# Patient Record
Sex: Female | Born: 1950 | Race: White | Hispanic: No | Marital: Married | Smoking: Never smoker
Health system: Southern US, Community
[De-identification: ages and names within clinical notes are randomized; demographics above are authoritative.]

## PROBLEM LIST (undated history)

## (undated) HISTORY — PX: TONSILLECTOMY: SUR1361

---

## 2015-05-11 ENCOUNTER — Emergency Department (HOSPITAL_BASED_OUTPATIENT_CLINIC_OR_DEPARTMENT_OTHER): Payer: Worker's Compensation

## 2015-05-11 ENCOUNTER — Encounter (HOSPITAL_BASED_OUTPATIENT_CLINIC_OR_DEPARTMENT_OTHER): Payer: Self-pay | Admitting: *Deleted

## 2015-05-11 ENCOUNTER — Emergency Department (HOSPITAL_BASED_OUTPATIENT_CLINIC_OR_DEPARTMENT_OTHER)
Admission: EM | Admit: 2015-05-11 | Discharge: 2015-05-11 | Disposition: A | Discharge status: Home / Self Care | Payer: Worker's Compensation | Attending: Emergency Medicine | Admitting: Emergency Medicine

## 2015-05-11 DIAGNOSIS — Y9341 Activity, dancing: Secondary | ICD-10-CM | POA: Insufficient documentation

## 2015-05-11 DIAGNOSIS — Y9289 Other specified places as the place of occurrence of the external cause: Secondary | ICD-10-CM | POA: Insufficient documentation

## 2015-05-11 DIAGNOSIS — Z880 Allergy status to penicillin: Secondary | ICD-10-CM | POA: Diagnosis not present

## 2015-05-11 DIAGNOSIS — S8391XA Sprain of unspecified site of right knee, initial encounter: Secondary | ICD-10-CM | POA: Diagnosis not present

## 2015-05-11 DIAGNOSIS — Y998 Other external cause status: Secondary | ICD-10-CM | POA: Insufficient documentation

## 2015-05-11 DIAGNOSIS — X500XXA Overexertion from strenuous movement or load, initial encounter: Secondary | ICD-10-CM | POA: Diagnosis not present

## 2015-05-11 DIAGNOSIS — S8991XA Unspecified injury of right lower leg, initial encounter: Secondary | ICD-10-CM | POA: Diagnosis present

## 2015-05-11 NOTE — ED Provider Notes (Signed)
CSN: [id number]     Arrival date & time 05/11/15  1606 History   First MD Initiated Contact with Patient 05/11/15 1629     Chief Complaint  Patient presents with  . Knee Injury     (Consider location/radiation/quality/duration/timing/severity/associated sxs/prior Treatment) HPI Comments: Patient is a 65 year old female with no significant past medical history. She presents for evaluation of right knee pain. 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 She reports pain to the inside of her right knee. She is able to ambulate, however there is some discomfort involved. She denies any prior significant injury to this knee.  Patient is a 65 y.o. female presenting with knee pain. The history is provided by the patient.  Knee Pain Location:  Knee Time since incident:  1 day Injury: yes   Knee location:  R knee Pain details:    Quality:  Aching   Radiates to:  Does not radiate   Severity:  Moderate   Onset quality:  Sudden   Timing:  Constant   Progression:  Unchanged Chronicity:  New   History reviewed. No pertinent past medical history. Past Surgical History  Procedure Laterality Date  . Tonsillectomy     No family history on file. Social History  Substance Use Topics  . Smoking status: Never Smoker   . Smokeless tobacco: None  . Alcohol Use: Yes     Comment: occ   OB History    No data available     Review of Systems  All other systems reviewed and are negative.     Allergies  Penicillins  Home Medications   Prior to Admission medications   Not on File   BP 163/72 mmHg  Pulse 66  Temp(Src) 98.3 F (36.8 C) (Oral)  Resp 18  Ht  (1.626 m)  Wt 140 lb (63.504 kg)  BMI 24.02 kg/m2  SpO2 100% Physical Exam  Constitutional: She is oriented to person, place, and time. She appears well-developed and well-nourished. No distress.  HENT:  Head: Normocephalic and atraumatic.  Neck: Normal  range of motion. Neck supple.  Musculoskeletal:  The right knee appears grossly normal. There is no effusion. She has good range of motion without crepitus. Anterior and posterior drawer tests are negative and there is no laxity with varus or valgus stress. Distal PMS is intact.  Neurological: She is alert and oriented to person, place, and time.  Skin: Skin is warm and dry. She is not diaphoretic.  Nursing note and vitals reviewed.   ED Course  Procedures (including critical care time) Labs Review Labs Reviewed - No data to display  Imaging Review Dg Knee Complete 4 Views Right  05/11/2015  CLINICAL DATA:  Twisting injury while dancing, initial encounter EXAM: RIGHT KNEE - COMPLETE 4+ VIEW COMPARISON:  None. FINDINGS: Mild degenerative changes are noted medially with osteophytes. No acute fracture or dislocation is noted. No joint effusion is seen. IMPRESSION: Mild degenerative changes without acute abnormality. Electronically Signed   By: Alcide Clever M.D.   On: 05/11/2015 16:31   I have personally reviewed and evaluated these images and lab results as part of my medical decision-making.   EKG Interpretation None      MDM   Final diagnoses:  None    X-rays negative. She has tenderness over the medial aspect of the knee consistent with an MCL sprain. The knee joint is stable without evidence for internal derangement. She  will be placed in an Ace bandage and advised to rest, take NSAIDs as needed for pain, and return as needed for any problems.    Geoffery Lyons, MD 05/11/15 [phone]

## 2015-05-11 NOTE — ED Notes (Signed)
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

## 2015-05-11 NOTE — Discharge Instructions (Signed)
Wear Ace bandage as applied for the next several days.  Rest.  Ice for 20 minutes every 2 hours while awake for the next 2 days.  Follow-up with your primary Dr. if not improving in the next 1-2 weeks.   Knee Sprain A knee sprain is a tear in one of the strong, fibrous tissues that connect the bones (ligaments) in your knee. The severity of the sprain depends on how much of the ligament is torn. The tear can be either partial or complete. CAUSES  Often, sprains are a result of a fall or injury. The force of the impact causes the fibers of your ligament to stretch too much. This excess tension causes the fibers of your ligament to tear. SIGNS AND SYMPTOMS  You may have some loss of motion in your knee. Other symptoms include:  Bruising.  Pain in the knee area.  Tenderness of the knee to the touch.  Swelling. DIAGNOSIS  To diagnose a knee sprain, your health care provider will physically examine your knee. Your health care provider may also suggest an X-ray exam of your knee to make sure no bones are broken. TREATMENT  If your ligament is only partially torn, treatment usually involves keeping the knee in a fixed position (immobilization) or bracing your knee for activities that require movement for several weeks. To do this, your health care provider will apply a bandage, cast, or splint to keep your knee from moving and to support your knee during movement until it heals. For a partially torn ligament, the healing process usually takes 4-6 weeks. If your ligament is completely torn, depending on which ligament it is, you may need surgery to reconnect the ligament to the bone or reconstruct it. After surgery, a cast or splint may be applied and will need to stay on your knee for 4-6 weeks while your ligament heals. HOME CARE INSTRUCTIONS  Keep your injured knee elevated to decrease swelling.  To ease pain and swelling, apply ice to the injured area:  Put ice in a plastic  bag.  Place a towel between your skin and the bag.  Leave the ice on for 20 minutes, 2-3 times a day.  Only take medicine for pain as directed by your health care provider.  Do not leave your knee unprotected until pain and stiffness go away (usually 4-6 weeks).  If you have a cast or splint, do not allow it to get wet. If you have been instructed not to remove it, cover it with a plastic bag when you shower or bathe. Do not swim.  Your health care provider may suggest exercises for you to do during your recovery to prevent or limit permanent weakness and stiffness. SEEK IMMEDIATE MEDICAL CARE IF:  Your cast or splint becomes damaged.  Your pain becomes worse.  You have significant pain, swelling, or numbness below the cast or splint. MAKE SURE YOU:  Understand these instructions.  Will watch your condition.  Will get help right away if you are not doing well or get worse.   This information is not intended to replace advice given to you by your health care provider. Make sure you discuss any questions you have with your health care provider.   Document Released: 03/27/2005 Document Revised: 04/17/2014 Document Reviewed: 11/06/2012 [employer]vier Interactive Patient Education [employer]

## 2015-05-11 NOTE — ED Notes (Signed)
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 C/O pain to same. CNS intact.

## 2015-05-11 NOTE — ED Notes (Signed)
MD at bedside. 

## 2017-02-10 IMAGING — CR DG KNEE COMPLETE 4+V*R*
4 series · 4 of 4 positions shown · non-contrast
Comparison: None.

CLINICAL DATA: Twisting injury while dancing, initial encounter

EXAM:
RIGHT KNEE - COMPLETE 4+ VIEW

[t knee ap right]
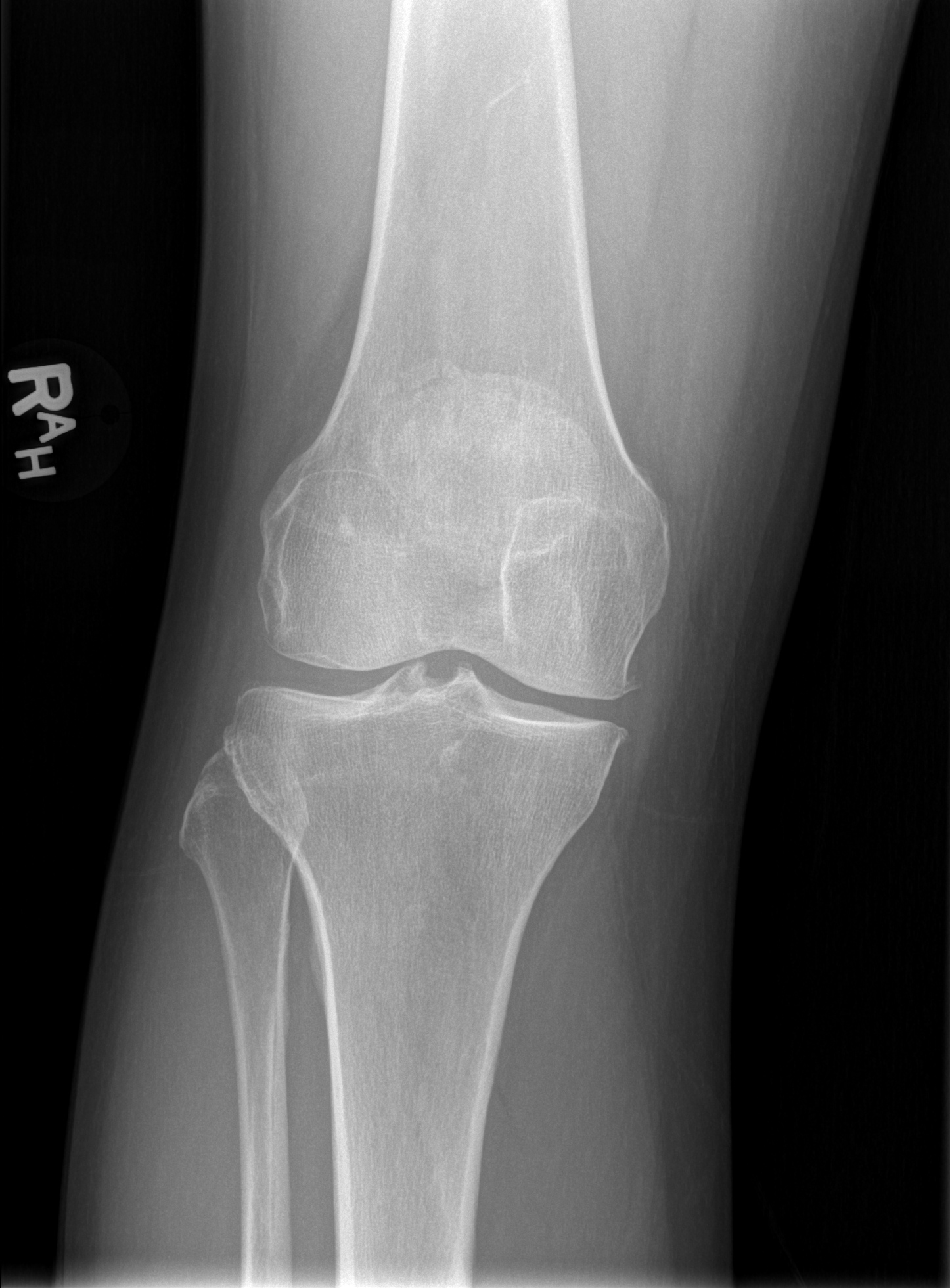

[t knee oblique right (1 of 2)]
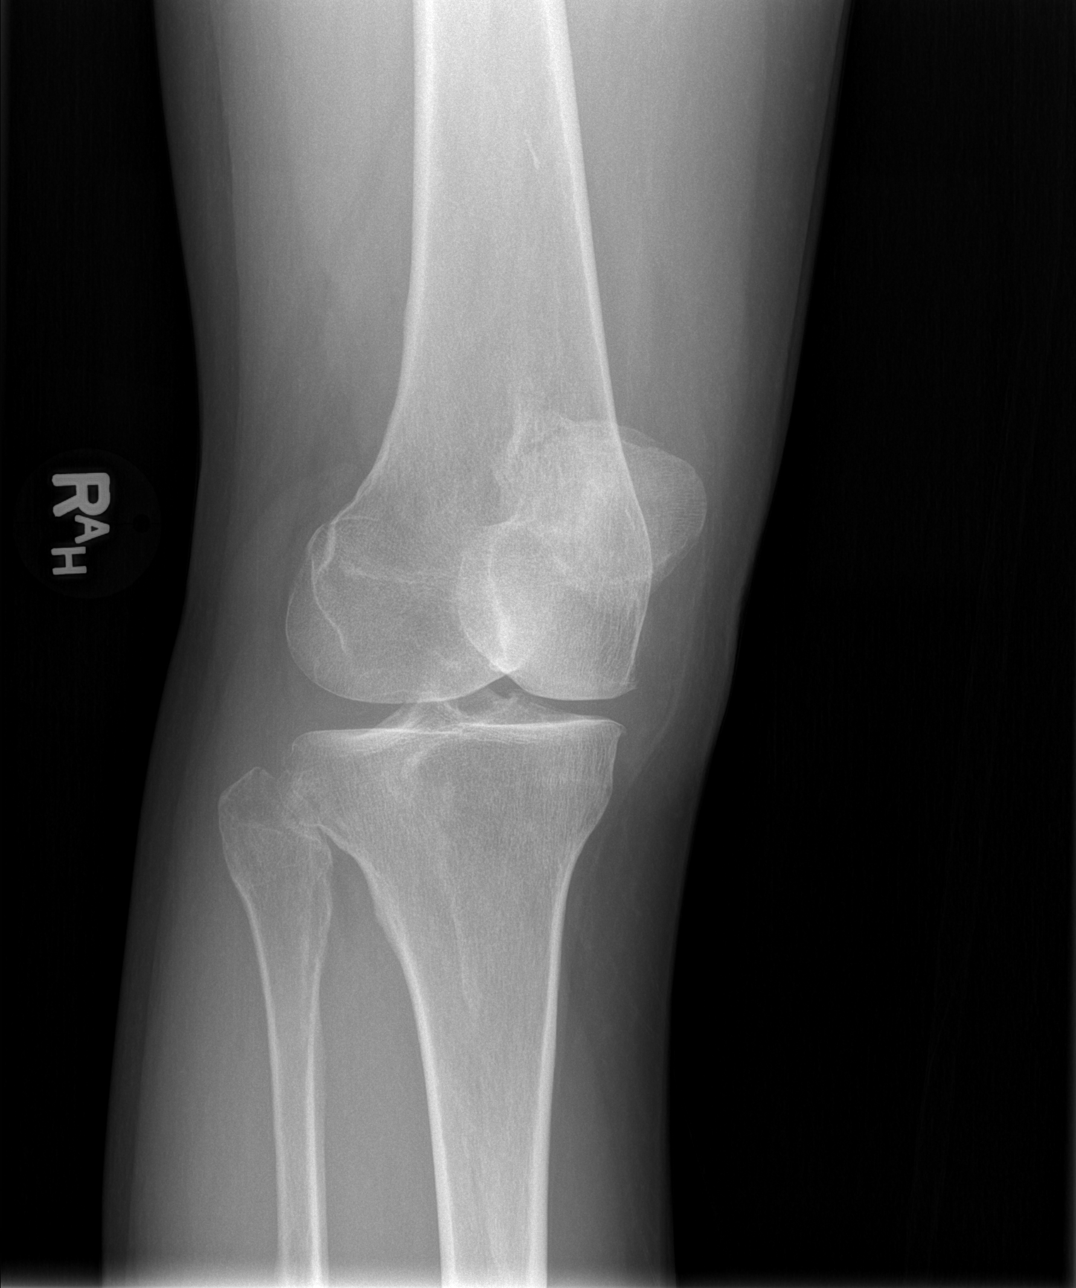

[t knee oblique right (2 of 2)]
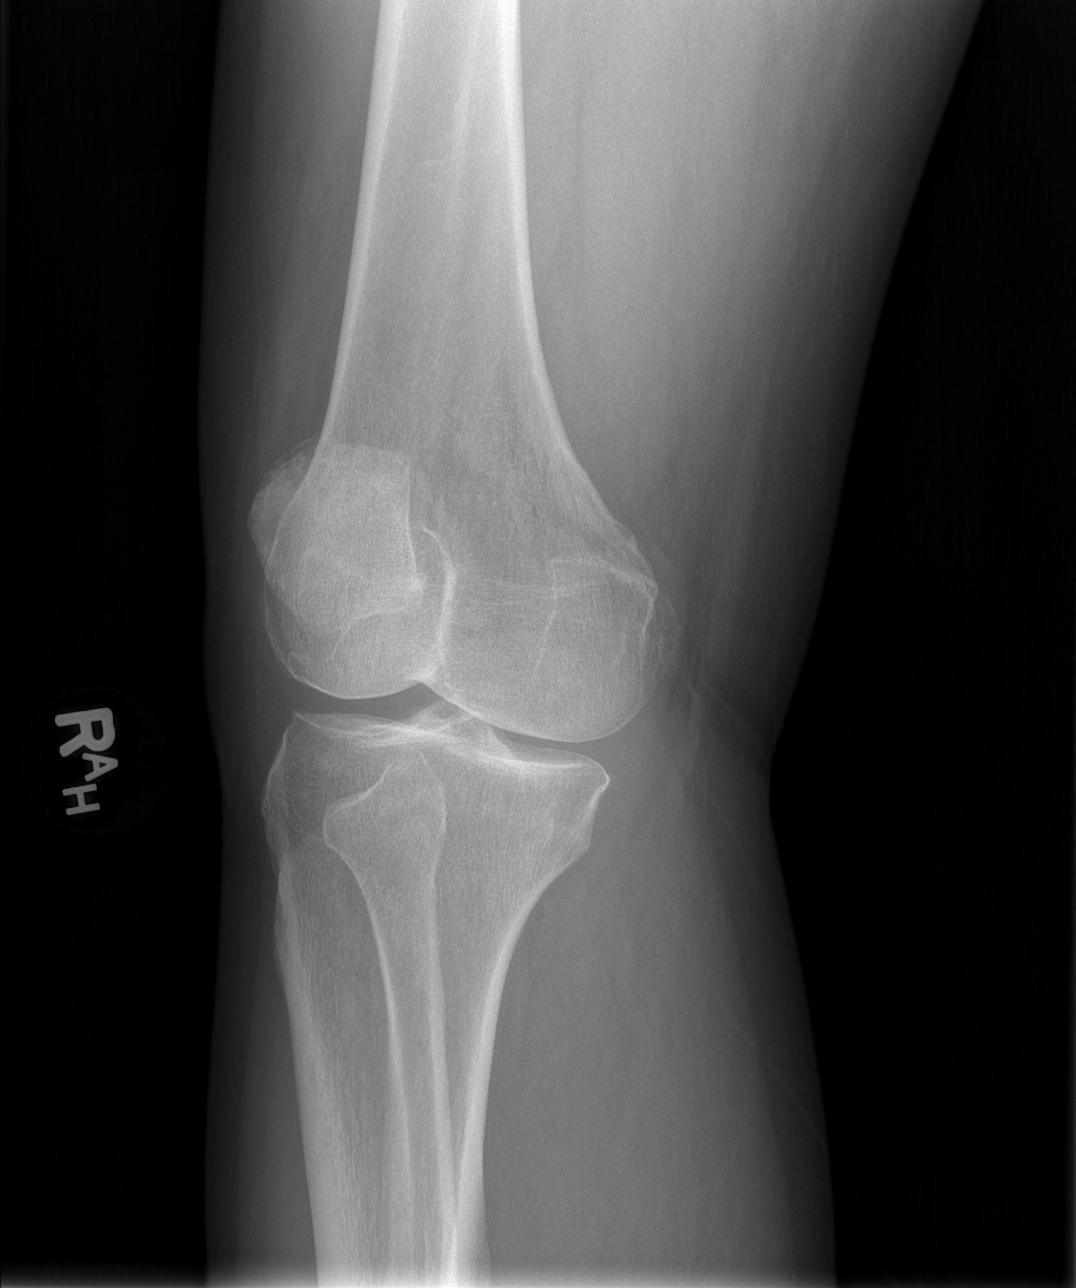

[t knee lat right]
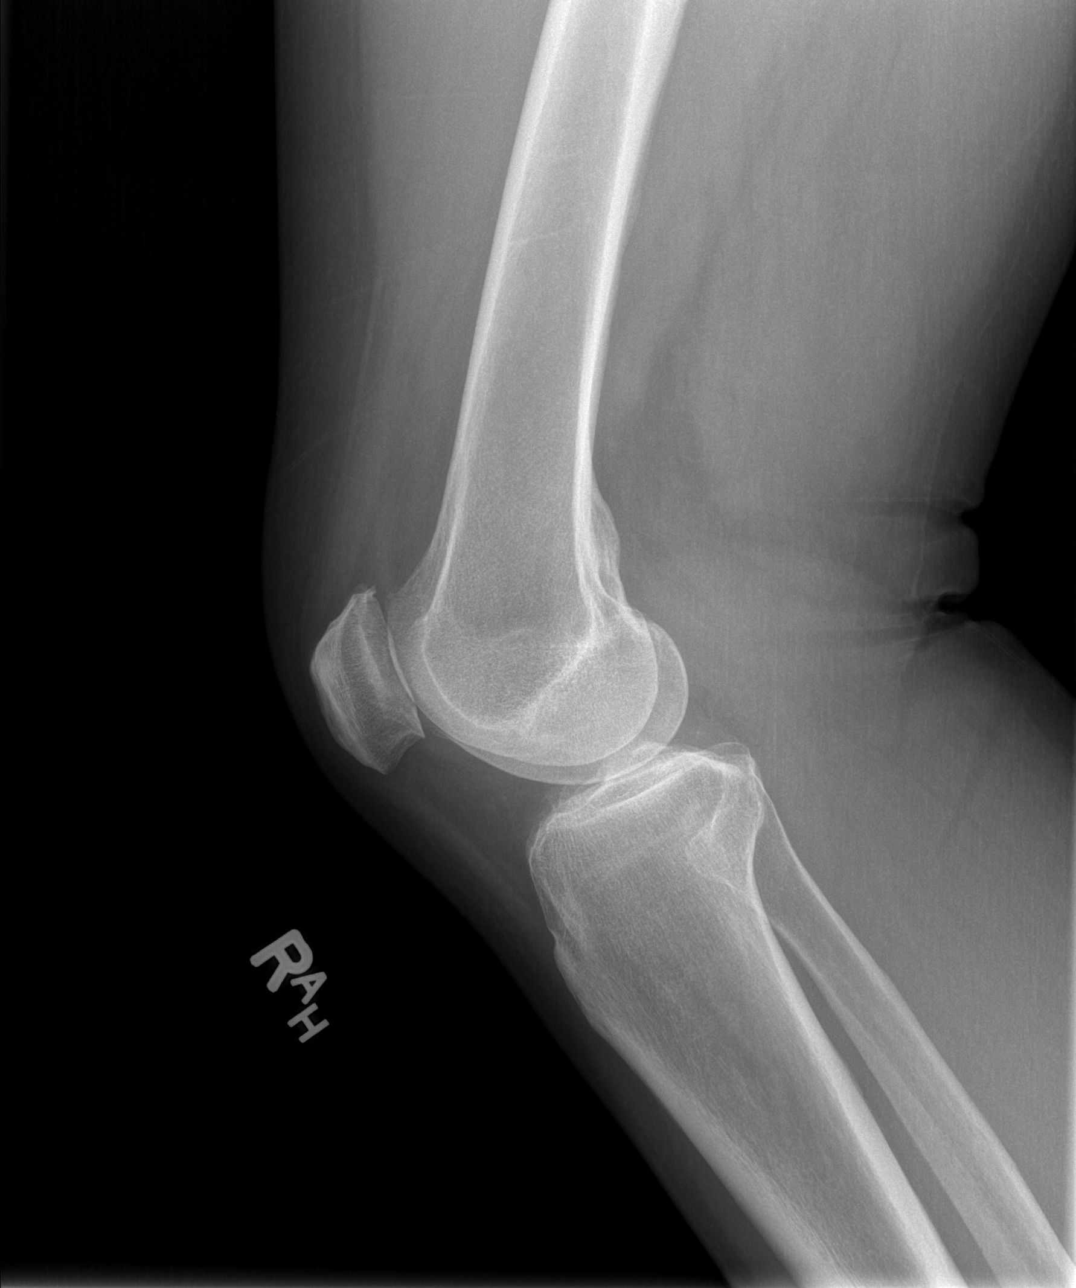

[4 of 4 positions shown; findings below may reference images not displayed]

FINDINGS: Mild degenerative changes are noted medially with osteophytes. No
acute fracture or dislocation is noted. No joint effusion is seen.
IMPRESSION: Mild degenerative changes without acute abnormality.
